# Patient Record
Sex: Female | Born: 1975 | Race: White | Hispanic: No | Marital: Married | Smoking: Never smoker
Health system: Southern US, Community
[De-identification: ages and names within clinical notes are randomized; demographics above are authoritative.]

## PROBLEM LIST (undated history)

## (undated) DIAGNOSIS — F32A Depression, unspecified: Secondary | ICD-10-CM

## (undated) DIAGNOSIS — E7800 Pure hypercholesterolemia, unspecified: Secondary | ICD-10-CM

## (undated) DIAGNOSIS — E282 Polycystic ovarian syndrome: Secondary | ICD-10-CM

## (undated) DIAGNOSIS — R7303 Prediabetes: Secondary | ICD-10-CM

## (undated) DIAGNOSIS — E559 Vitamin D deficiency, unspecified: Secondary | ICD-10-CM

## (undated) DIAGNOSIS — I10 Essential (primary) hypertension: Secondary | ICD-10-CM

## (undated) DIAGNOSIS — N979 Female infertility, unspecified: Secondary | ICD-10-CM

## (undated) DIAGNOSIS — K760 Fatty (change of) liver, not elsewhere classified: Secondary | ICD-10-CM

## (undated) DIAGNOSIS — E538 Deficiency of other specified B group vitamins: Secondary | ICD-10-CM

## (undated) HISTORY — DX: Vitamin D deficiency, unspecified: E55.9

## (undated) HISTORY — DX: Female infertility, unspecified: N97.9

## (undated) HISTORY — DX: Fatty (change of) liver, not elsewhere classified: K76.0

## (undated) HISTORY — PX: TONSILLECTOMY: SUR1361

## (undated) HISTORY — DX: Deficiency of other specified B group vitamins: E53.8

## (undated) HISTORY — DX: Prediabetes: R73.03

## (undated) HISTORY — DX: Depression, unspecified: F32.A

## (undated) HISTORY — DX: Essential (primary) hypertension: I10

## (undated) HISTORY — DX: Pure hypercholesterolemia, unspecified: E78.00

## (undated) HISTORY — DX: Polycystic ovarian syndrome: E28.2

---
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
## 2000-08-04 ENCOUNTER — Encounter: Admission: RE | Admit: 2000-08-04 | Discharge: 2000-08-04 | Payer: Self-pay | Admitting: Internal Medicine

## 2000-08-04 ENCOUNTER — Encounter: Payer: Self-pay | Admitting: Internal Medicine

## 2001-02-09 ENCOUNTER — Other Ambulatory Visit: Admission: RE | Admit: 2001-02-09 | Discharge: 2001-02-09 | Payer: Self-pay | Admitting: Gynecology

## 2002-02-13 ENCOUNTER — Other Ambulatory Visit: Admission: RE | Admit: 2002-02-13 | Discharge: 2002-02-13 | Payer: Self-pay | Admitting: Gynecology

## 2002-05-24 ENCOUNTER — Emergency Department (HOSPITAL_COMMUNITY): Admission: EM | Admit: 2002-05-24 | Discharge: 2002-05-25 | Payer: Self-pay | Admitting: Emergency Medicine

## 2002-05-25 ENCOUNTER — Ambulatory Visit (HOSPITAL_COMMUNITY): Admission: RE | Admit: 2002-05-25 | Discharge: 2002-05-25 | Payer: Self-pay | Admitting: Surgery

## 2002-05-25 ENCOUNTER — Encounter: Payer: Self-pay | Admitting: Surgery

## 2002-07-27 ENCOUNTER — Encounter: Payer: Self-pay | Admitting: Urology

## 2002-07-27 ENCOUNTER — Encounter: Admission: RE | Admit: 2002-07-27 | Discharge: 2002-07-27 | Payer: Self-pay | Admitting: Urology

## 2003-02-28 ENCOUNTER — Other Ambulatory Visit: Admission: RE | Admit: 2003-02-28 | Discharge: 2003-02-28 | Payer: Self-pay | Admitting: Gynecology

## 2003-03-02 ENCOUNTER — Emergency Department (HOSPITAL_COMMUNITY): Admission: EM | Admit: 2003-03-02 | Discharge: 2003-03-02 | Payer: Self-pay | Admitting: Emergency Medicine

## 2003-04-27 ENCOUNTER — Ambulatory Visit (HOSPITAL_COMMUNITY): Admission: RE | Admit: 2003-04-27 | Discharge: 2003-04-27 | Payer: Self-pay | Admitting: Gastroenterology

## 2003-08-18 HISTORY — PX: GALLBLADDER SURGERY: SHX652

## 2004-06-25 ENCOUNTER — Encounter: Admission: RE | Admit: 2004-06-25 | Discharge: 2004-06-25 | Payer: Self-pay | Admitting: Internal Medicine

## 2004-06-26 ENCOUNTER — Ambulatory Visit (HOSPITAL_COMMUNITY): Admission: RE | Admit: 2004-06-26 | Discharge: 2004-06-26 | Payer: Self-pay | Admitting: Internal Medicine

## 2004-07-03 ENCOUNTER — Encounter: Admission: RE | Admit: 2004-07-03 | Discharge: 2004-07-03 | Payer: Self-pay | Admitting: Internal Medicine

## 2004-07-15 ENCOUNTER — Encounter: Admission: RE | Admit: 2004-07-15 | Discharge: 2004-07-15 | Payer: Self-pay | Admitting: Gastroenterology

## 2004-07-22 ENCOUNTER — Encounter: Admission: RE | Admit: 2004-07-22 | Discharge: 2004-07-22 | Payer: Self-pay | Admitting: Internal Medicine

## 2004-08-06 ENCOUNTER — Ambulatory Visit (HOSPITAL_COMMUNITY): Admission: RE | Admit: 2004-08-06 | Discharge: 2004-08-06 | Payer: Self-pay | Admitting: Gastroenterology

## 2004-08-22 ENCOUNTER — Ambulatory Visit (HOSPITAL_COMMUNITY): Admission: RE | Admit: 2004-08-22 | Discharge: 2004-08-22 | Payer: Self-pay | Admitting: Gastroenterology

## 2004-12-11 ENCOUNTER — Encounter: Admission: RE | Admit: 2004-12-11 | Discharge: 2005-03-11 | Payer: Self-pay | Admitting: Obstetrics and Gynecology

## 2005-03-24 ENCOUNTER — Ambulatory Visit: Payer: Self-pay | Admitting: Internal Medicine

## 2005-04-01 ENCOUNTER — Ambulatory Visit (HOSPITAL_COMMUNITY): Admission: RE | Admit: 2005-04-01 | Discharge: 2005-04-01 | Payer: Self-pay | Admitting: Internal Medicine

## 2005-04-16 ENCOUNTER — Ambulatory Visit: Payer: Self-pay | Admitting: Internal Medicine

## 2005-06-29 ENCOUNTER — Ambulatory Visit (HOSPITAL_COMMUNITY): Admission: RE | Admit: 2005-06-29 | Discharge: 2005-06-29 | Payer: Self-pay | Admitting: Surgery

## 2006-06-17 ENCOUNTER — Encounter: Admission: RE | Admit: 2006-06-17 | Discharge: 2006-06-17 | Payer: Self-pay | Admitting: Internal Medicine

## 2006-06-24 ENCOUNTER — Encounter: Admission: RE | Admit: 2006-06-24 | Discharge: 2006-06-24 | Payer: Self-pay | Admitting: Internal Medicine

## 2006-07-05 ENCOUNTER — Encounter: Admission: RE | Admit: 2006-07-05 | Discharge: 2006-08-02 | Payer: Self-pay | Admitting: Internal Medicine

## 2007-11-11 IMAGING — CR DG LUMBAR SPINE COMPLETE 4+V
5 series · 5 of 5 positions shown · non-contrast
Comparison: none

CLINICAL DATA: Bilateral leg numbness. 
LUMBAR SPINE ? 5 VIEW:

[view not recorded (1 of 5)]
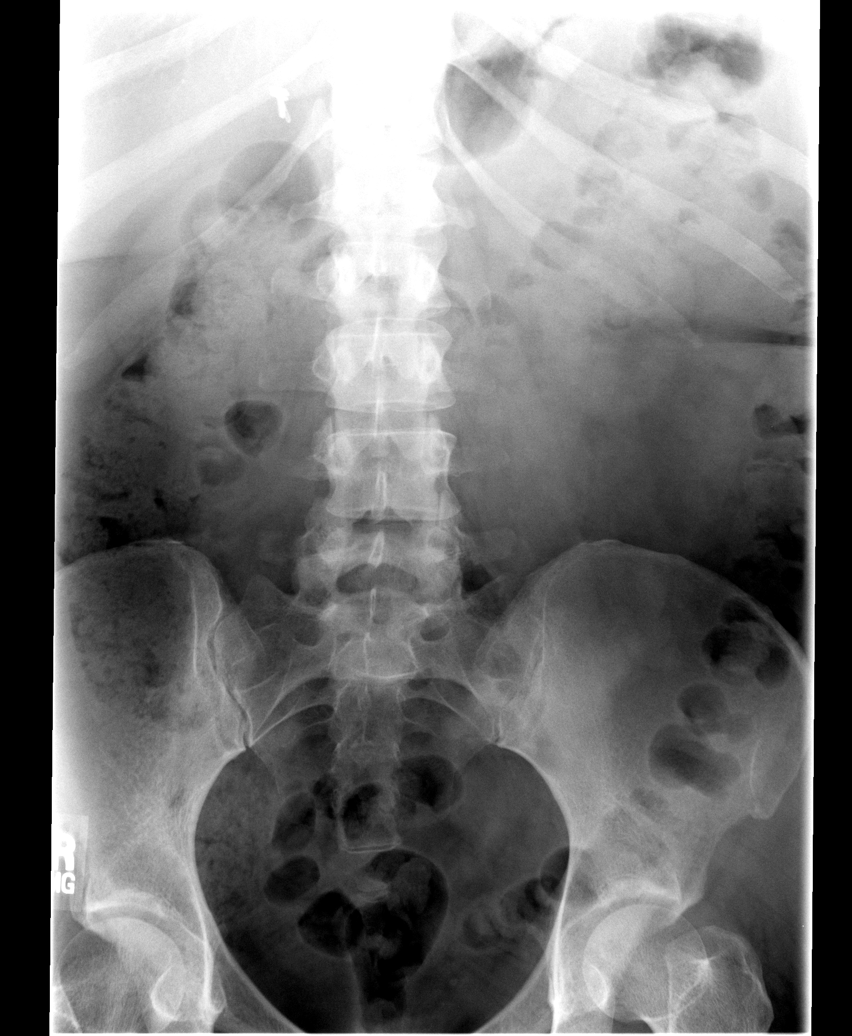

[view not recorded (2 of 5)]
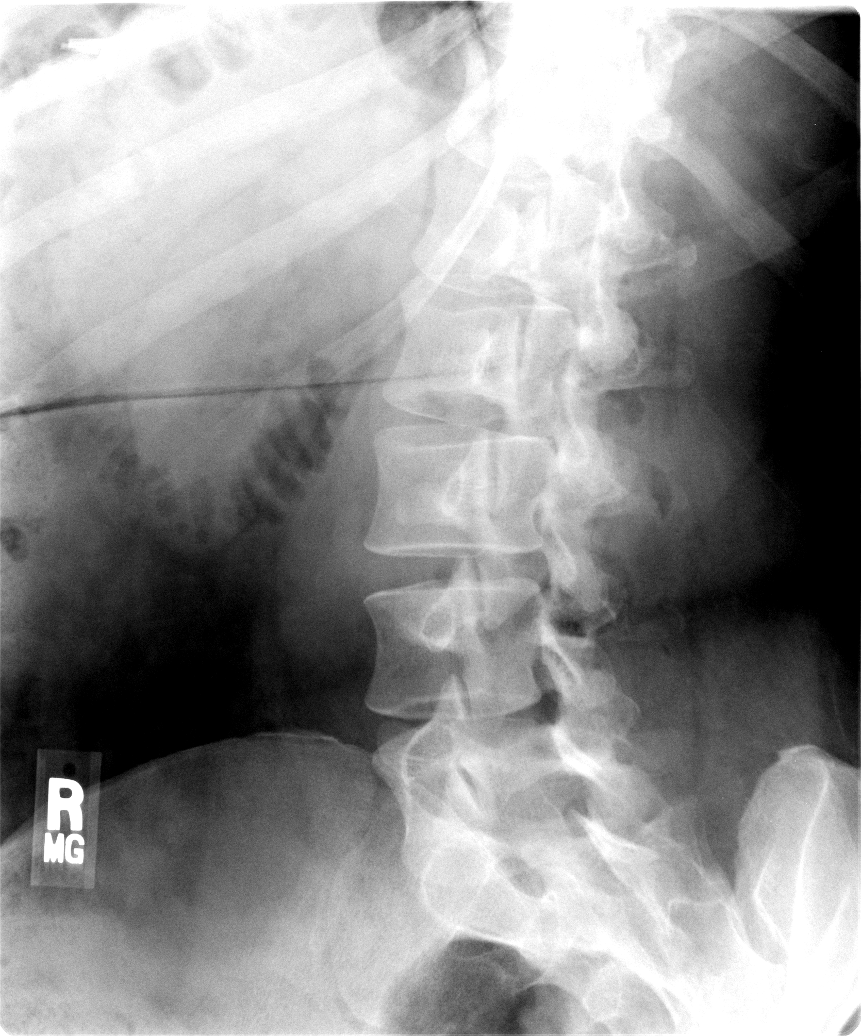

[view not recorded (3 of 5)]
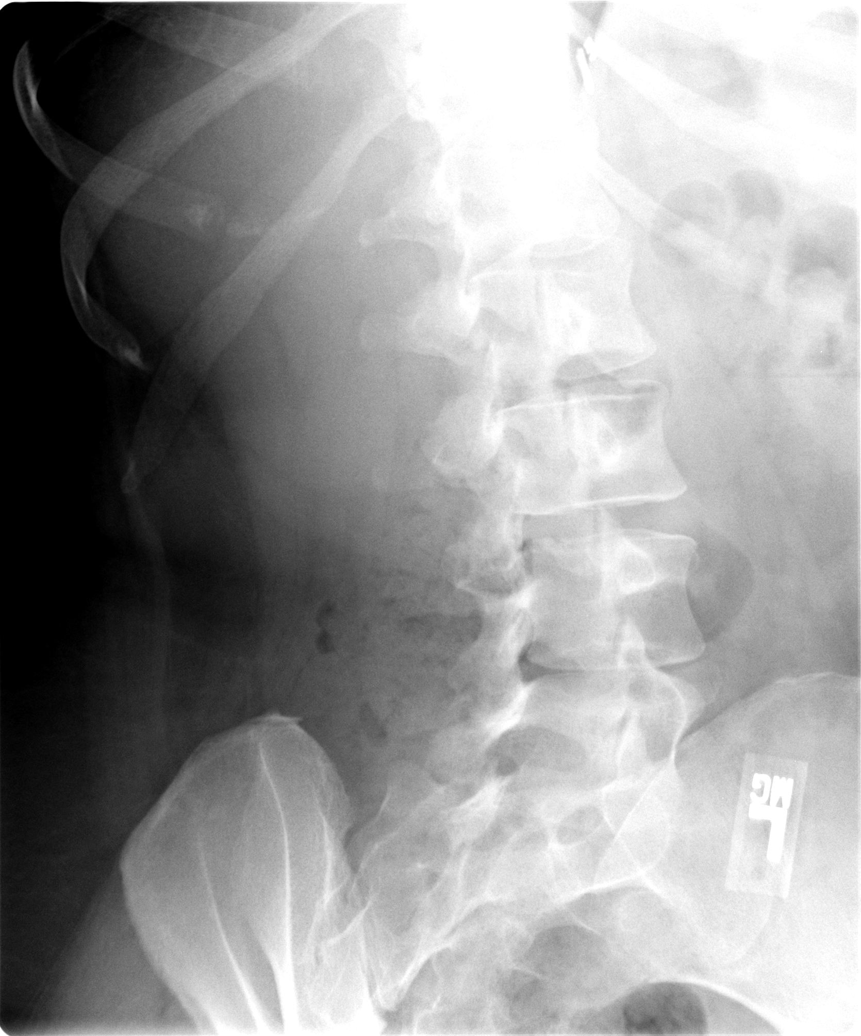

[view not recorded (4 of 5)]
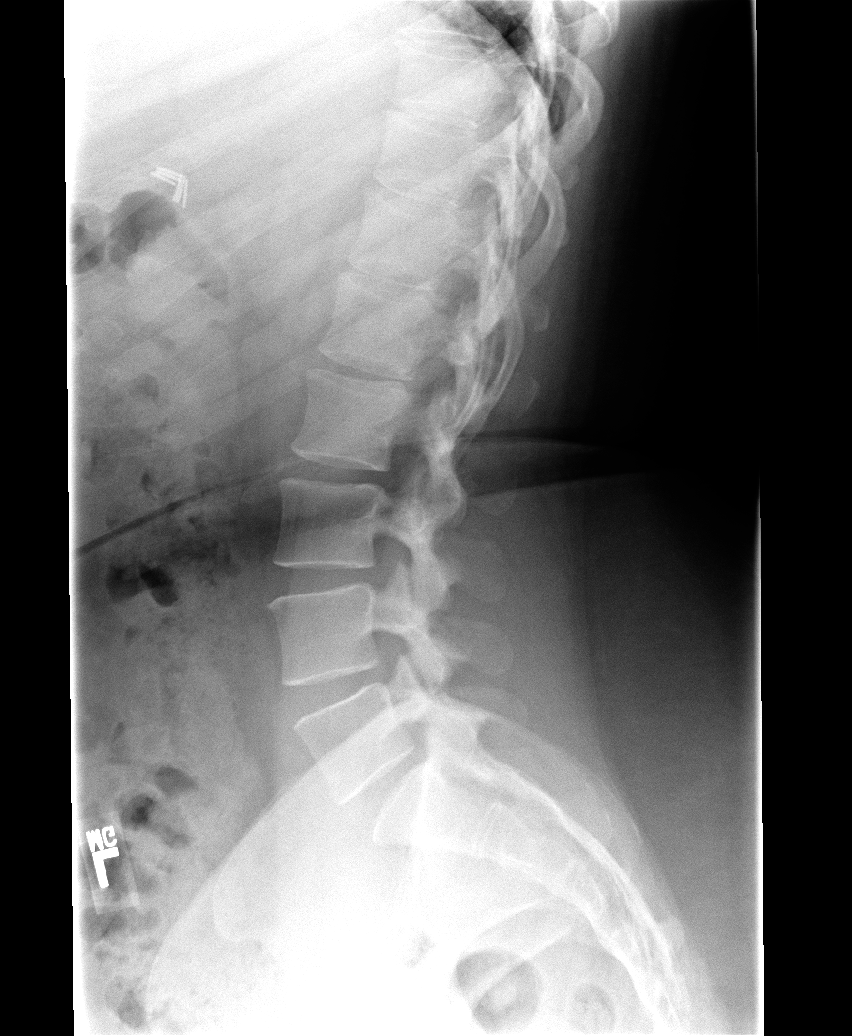

[view not recorded (5 of 5)]
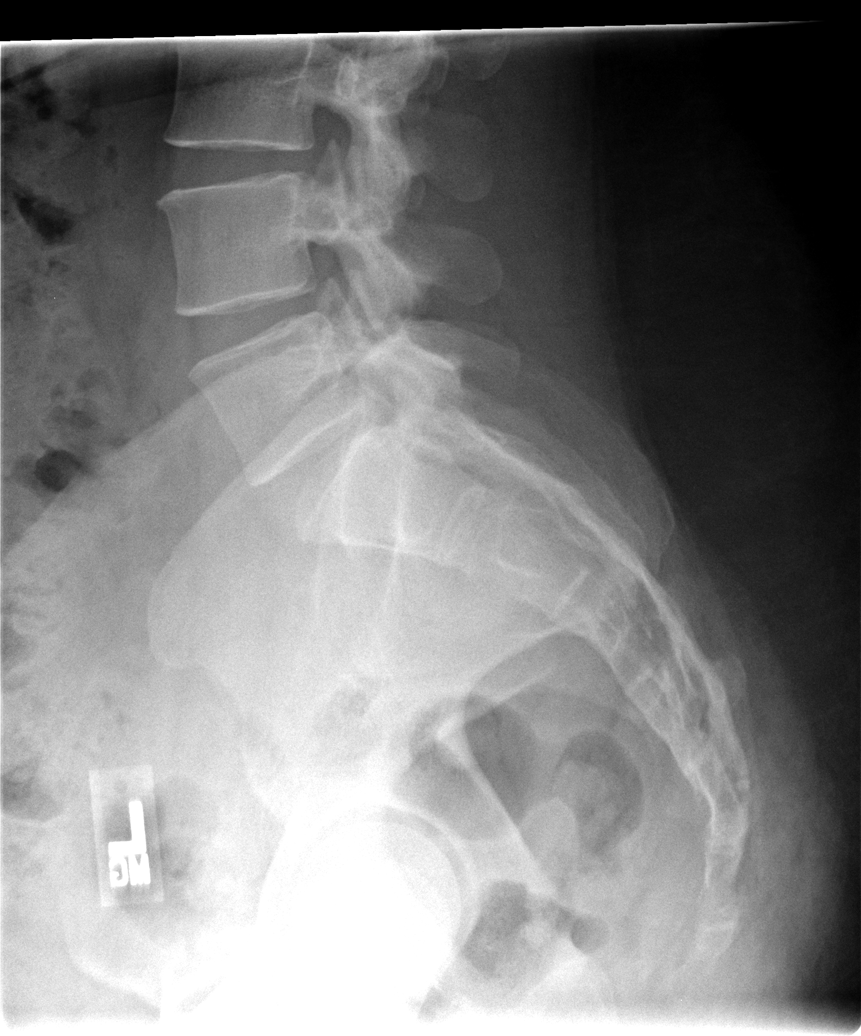

[5 of 5 positions shown; findings below may reference images not displayed]

FINDINGS: There is no evidence of lumbar spine fracture.  Alignment is normal.  Intervertebral disc spaces are maintained, and no other significant bone abnormalities are identified.
IMPRESSION: Negative lumbar spine radiographs.

## 2010-09-07 ENCOUNTER — Encounter: Payer: Self-pay | Admitting: Unknown Physician Specialty

## 2011-06-18 HISTORY — PX: DILATION AND EVACUATION: SHX1459

## 2021-10-02 LAB — BASIC METABOLIC PANEL
BUN: 13 (ref 4–21)
Chloride: 100 (ref 99–108)
Creatinine: 0.7 (ref 0.5–1.1)
Sodium: 135 — AB (ref 137–147)

## 2021-10-02 LAB — LIPID PANEL
Cholesterol: 162 (ref 0–200)
LDL Cholesterol: 99
Triglycerides: 109 (ref 40–160)

## 2021-10-02 LAB — HEPATIC FUNCTION PANEL
ALT: 19 U/L (ref 7–35)
AST: 21 (ref 13–35)
Alkaline Phosphatase: 134 — AB (ref 25–125)

## 2021-10-02 LAB — CBC: RBC: 4.6 (ref 3.87–5.11)

## 2021-10-02 LAB — HEMOGLOBIN A1C: Hemoglobin A1C: 5.5

## 2021-10-02 LAB — CBC AND DIFFERENTIAL: WBC: 9.8

## 2021-10-02 LAB — VITAMIN D 25 HYDROXY (VIT D DEFICIENCY, FRACTURES): Vit D, 25-Hydroxy: 25.1

## 2021-10-31 DIAGNOSIS — Z0289 Encounter for other administrative examinations: Secondary | ICD-10-CM

## 2021-11-11 ENCOUNTER — Encounter (INDEPENDENT_AMBULATORY_CARE_PROVIDER_SITE_OTHER): Payer: Self-pay | Admitting: Family Medicine

## 2021-11-11 ENCOUNTER — Ambulatory Visit (INDEPENDENT_AMBULATORY_CARE_PROVIDER_SITE_OTHER): Payer: 59 | Admitting: Family Medicine

## 2021-11-11 ENCOUNTER — Encounter (INDEPENDENT_AMBULATORY_CARE_PROVIDER_SITE_OTHER): Payer: Self-pay

## 2021-11-11 ENCOUNTER — Other Ambulatory Visit (INDEPENDENT_AMBULATORY_CARE_PROVIDER_SITE_OTHER): Payer: Self-pay

## 2021-11-11 ENCOUNTER — Other Ambulatory Visit: Payer: Self-pay

## 2021-11-11 VITALS — BP 124/84 | HR 72 | Temp 97.9°F | Ht 64.0 in | Wt 273.0 lb

## 2021-11-11 DIAGNOSIS — R0602 Shortness of breath: Secondary | ICD-10-CM

## 2021-11-11 DIAGNOSIS — E66813 Obesity, class 3: Secondary | ICD-10-CM

## 2021-11-11 DIAGNOSIS — R5383 Other fatigue: Secondary | ICD-10-CM

## 2021-11-11 DIAGNOSIS — Z6841 Body Mass Index (BMI) 40.0 and over, adult: Secondary | ICD-10-CM

## 2021-11-11 DIAGNOSIS — I10 Essential (primary) hypertension: Secondary | ICD-10-CM

## 2021-11-11 DIAGNOSIS — E669 Obesity, unspecified: Secondary | ICD-10-CM

## 2021-11-11 DIAGNOSIS — Z1331 Encounter for screening for depression: Secondary | ICD-10-CM | POA: Diagnosis not present

## 2021-11-11 DIAGNOSIS — K760 Fatty (change of) liver, not elsewhere classified: Secondary | ICD-10-CM | POA: Diagnosis not present

## 2021-11-11 DIAGNOSIS — E8881 Metabolic syndrome: Secondary | ICD-10-CM

## 2021-11-11 DIAGNOSIS — E88819 Insulin resistance, unspecified: Secondary | ICD-10-CM

## 2021-11-11 DIAGNOSIS — E042 Nontoxic multinodular goiter: Secondary | ICD-10-CM

## 2021-11-11 DIAGNOSIS — E559 Vitamin D deficiency, unspecified: Secondary | ICD-10-CM

## 2021-11-11 DIAGNOSIS — E7849 Other hyperlipidemia: Secondary | ICD-10-CM

## 2021-11-12 LAB — VITAMIN B12: Vitamin B-12: 710 pg/mL (ref 232–1245)

## 2021-11-12 LAB — COMPREHENSIVE METABOLIC PANEL
ALT: 19 IU/L (ref 0–32)
AST: 16 IU/L (ref 0–40)
Albumin/Globulin Ratio: 1.7 (ref 1.2–2.2)
Albumin: 4.5 g/dL (ref 3.8–4.8)
Alkaline Phosphatase: 157 IU/L — ABNORMAL HIGH (ref 44–121)
BUN/Creatinine Ratio: 19 (ref 9–23)
BUN: 13 mg/dL (ref 6–24)
Bilirubin Total: 0.3 mg/dL (ref 0.0–1.2)
CO2: 20 mmol/L (ref 20–29)
Calcium: 9.8 mg/dL (ref 8.7–10.2)
Chloride: 102 mmol/L (ref 96–106)
Creatinine, Ser: 0.68 mg/dL (ref 0.57–1.00)
Globulin, Total: 2.6 g/dL (ref 1.5–4.5)
Glucose: 87 mg/dL (ref 70–99)
Potassium: 4.4 mmol/L (ref 3.5–5.2)
Sodium: 135 mmol/L (ref 134–144)
Total Protein: 7.1 g/dL (ref 6.0–8.5)
eGFR: 111 mL/min/{1.73_m2} (ref >59)

## 2021-11-12 LAB — FOLATE: Folate: >20 ng/mL (ref >3.0)

## 2021-11-17 NOTE — Progress Notes (Signed)
? ? ? ? ?Chief Complaint:  ? ?OBESITY ?Sabrina Jones (MR# [id number]) is a 46 y.o. female who presents for evaluation and treatment of obesity and related comorbidities. Current BMI is Body mass index is 46.86 kg/m?Marland Kitchen Sabrina Jones has been struggling with her weight for many years and has been unsuccessful in either losing weight, maintaining weight loss, or reaching her healthy weight goal. ? 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 She has previously lived in [place] and just moved back to [place]. Skips breakfast in the past. Breakfast-2 eggs with spray oil, maybe decaf coffee (satisfied). Lunch-Mexican restaurant-chips, salsa, grilled chicken salad, and guacamole (doesntt eat whole portion) (full) Dinner-meat, Maybe 6 oz, 2 cups of vegetables (feels full). ? ?Sabrina Jones is currently in the action stage of change and ready to dedicate time achieving and maintaining a healthier weight. Sabrina Jones is interested in becoming our patient and working on intensive lifestyle modifications including (but not limited to) diet and exercise for weight loss. ? ?Sabrina Jones's habits were reviewed today and are as follows: Her family eats meals together, she thinks her family will eat healthier with her, her desired weight loss is 123-133 lbs, she has been heavy most of her life, she started gaining weight during teen years, her heaviest weight ever was 273 pounds, she has significant food cravings issues, she skips meals frequently, she is frequently drinking liquids with calories, she frequently makes poor food choices, and she struggles with emotional eating. ? ?Depression Screen ?Sabrina Jones's Food and Mood (modified PHQ-9) score was 7. ? ? ?  11/11/2021  ?  8:40 AM  ?Depression screen PHQ 2/9  ?Decreased Interest 2  ?Down, Depressed, Hopeless 1  ?PHQ - 2 Score 3  ?Altered sleeping 0  ?Tired, decreased energy 2  ?Change in appetite 1  ?Feeling bad or failure about yourself  1  ?Trouble concentrating 0  ?Moving slowly or  fidgety/restless 0  ?Suicidal thoughts 0  ?PHQ-9 Score 7  ?Difficult doing work/chores Somewhat difficult  ? ?Subjective:  ? ?1. Other fatigue ?Sabrina Jones admits to daytime somnolence and denies waking up still tired. Patient has a history of symptoms of daytime fatigue. Sabrina Jones generally gets 8 or 10 hours of sleep per night, and states that she has generally restful sleep. Snoring is present. Apneic episodes are not present. Epworth Sleepiness Score is 1. EKG-NSR at  65 BPM. ? ?2. SOBOE (shortness of breath on exertion) ?Sabrina Jones notes increasing shortness of breath with exercising and seems to be worsening over time with weight gain. She notes getting out of breath sooner with activity than she used to. This has not gotten worse recently. Sabrina Jones denies shortness of breath at rest or orthopnea. ? ?3. Multiple thyroid nodules ?Sabrina Jones had blood work in [place]. TSH 0.83, T3 3.45, and T4 1.28. She brought her labs in. Ultrasound on 10/02/2021-new nodules in both lobes of thyroid. Possibility of adenomatous nodules.  ? ?4. NAFLD (nonalcoholic fatty liver disease) ?Sabrina Jones was diagnosed years ago. Brought her labs with her. Ultrasound 10/02/2021-grade 3 fatty infiltration of the liver. ALT was 19, AST 21, and Alk Phos 134. ? ?5. Essential hypertension ?Sabrina Jones's blood pressure is controlled today. She was diagnosed in early college years. She is taking Diovan and nebivolol. She reports labile blood pressure in the past.  ? ?6. Other hyperlipidemia ?Sabrina Jones is on Crestor 5 mg. She was diagnosed a few years ago. Last LDL was 99, HDL 41.6, and triglycerides 109-labs done 10/02/2021. LFTs within normal limits. ? ?7.  Insulin resistance ?Sabrina Jones's last A1c was 5.5 and insulin 24.9 on 10/02/2021. She is not on medications.  ? ?8. Vitamin D deficiency ?Sabrina Jones's last Vitamin D level was of 25.1. She is on 50,000 IU weekly.  ? ?Assessment/Plan:  ? ?1. Other fatigue ?Sabrina Jones does feel that her weight is causing her energy to be lower than it  should be. Fatigue may be related to obesity, depression or many other causes. Labs will be ordered, and in the meanwhile, Sabrina Jones will focus on self care including making healthy food choices, increasing physical activity and focusing on stress reduction. ? ?- EKG 12-Lead ?- Vitamin B12 ?- Folate ? ?2. SOBOE (shortness of breath on exertion) ?Sabrina Jones does feel that she gets out of breath more easily that she used to when she exercises. Sabrina Jones's shortness of breath appears to be obesity related and exercise induced. She has agreed to work on weight loss and gradually increase exercise to treat her exercise induced shortness of breath. Will continue to monitor closely. ? ?3. Multiple thyroid nodules ?We will refer to Dr. Cruzita Lederer at [facility]. ? ?- Ambulatory referral to Endocrinology ? ?4. NAFLD (nonalcoholic fatty liver disease) ?We will check labs today, and we will follow up at Sabrina Jones next appointment. ? ?5. Essential hypertension ?We will check labs today, and we will follow up at Sabrina Jones next appointment. ? ?- Comprehensive metabolic panel ? ?6. Other hyperlipidemia ?We will repeat labs in 3 months.  ? ?7. Insulin resistance ?We will repeat labs in 3 months.  ? ?8. Vitamin D deficiency ?Sabrina Jones will continue her Vitamin D. We will repeat labs in 3 months.  ? ?9. Depression screening ?Sabrina Jones had a positive depression screening. Depression is commonly associated with obesity and often results in emotional eating behaviors. We will monitor this closely and work on CBT to help improve the non-hunger eating patterns. Referral to Psychology may be required if no improvement is seen as she continues in our clinic. ? ?10. Obesity with current BMI of 46.9 ?Sabrina Jones is currently in the action stage of change and her goal is to continue with weight loss efforts. I recommend Sabrina Jones begin the structured treatment plan as follows: ? ?She has agreed to the Category 4 Plan with 8 oz at night. ? ?Exercise goals: No  exercise has been prescribed at this time.  ? ?Behavioral modification strategies: increasing lean protein intake, no skipping meals, meal planning and cooking strategies, keeping healthy foods in the home, and planning for success. ? ?She was informed of the importance of frequent follow-up visits to maximize her success with intensive lifestyle modifications for her multiple health conditions. She was informed we would discuss her lab results at her next visit unless there is a critical issue that needs to be addressed sooner. Sabrina Jones agreed to keep her next visit at the agreed upon time to discuss these results. ? ?Objective:  ? ?Blood pressure 124/84, pulse 72, temperature 97.9 ?F (36.6 ?C), height 5' 4"  (1.626 m), weight 273 lb (123.8 kg), SpO2 98 %. Body mass index is 46.86 kg/m?. ? ?EKG: Normal sinus rhythm, rate 65 BPM. ? ?Indirect Calorimeter completed today shows a VO2 of 291 and a REE of 2002.  Her calculated basal metabolic rate is 5701 thus her basal metabolic rate is better than expected. ? ?General: Cooperative, alert, well developed, in no acute distress. ?HEENT: Conjunctivae and lids unremarkable. ?Cardiovascular: Regular rhythm.  ?Lungs: Normal work of breathing. ?Neurologic: No focal deficits.  ? ?Lab Results  ?Component Value Date  ?  CREATININE 0.68 11/11/2021  ? BUN 13 11/11/2021  ? NA 135 11/11/2021  ? K 4.4 11/11/2021  ? CL 102 11/11/2021  ? CO2 20 11/11/2021  ? ?Lab Results  ?Component Value Date  ? ALT 19 11/11/2021  ? AST 16 11/11/2021  ? ALKPHOS 157 (H) 11/11/2021  ? BILITOT 0.3 11/11/2021  ? ?Lab Results  ?Component Value Date  ? HGBA1C 5.5 10/02/2021  ? ?No results found for: INSULIN ?Lab Results  ?Component Value Date  ? TSH 0.83 10/02/2021  ? ?Lab Results  ?Component Value Date  ? CHOL 162 10/02/2021  ? HDL 42 10/02/2021  ? [place] 99 10/02/2021  ? TRIG 109 10/02/2021  ? ?Lab Results  ?Component Value Date  ? WBC 9.8 10/02/2021  ? HGB 13.6 10/02/2021  ? HCT 42 10/02/2021  ? PLT 319  10/02/2021  ? ?Lab Results  ?Component Value Date  ? TIBC 255 10/02/2021  ? FERRITIN 235.0 10/02/2021  ? ?Attestation Statements:  ? ?Reviewed by clinician on day of visit: allergies, medications, problem list, medical history

## 2021-11-25 ENCOUNTER — Ambulatory Visit (INDEPENDENT_AMBULATORY_CARE_PROVIDER_SITE_OTHER): Payer: 59 | Admitting: Family Medicine

## 2021-11-25 ENCOUNTER — Encounter (INDEPENDENT_AMBULATORY_CARE_PROVIDER_SITE_OTHER): Payer: Self-pay | Admitting: Family Medicine

## 2021-11-25 VITALS — BP 136/85 | HR 79 | Temp 98.6°F | Ht 64.0 in | Wt 272.0 lb

## 2021-11-25 DIAGNOSIS — E559 Vitamin D deficiency, unspecified: Secondary | ICD-10-CM

## 2021-11-25 DIAGNOSIS — E7849 Other hyperlipidemia: Secondary | ICD-10-CM | POA: Diagnosis not present

## 2021-11-25 DIAGNOSIS — I10 Essential (primary) hypertension: Secondary | ICD-10-CM

## 2021-11-25 DIAGNOSIS — E8881 Metabolic syndrome: Secondary | ICD-10-CM | POA: Diagnosis not present

## 2021-11-25 DIAGNOSIS — E042 Nontoxic multinodular goiter: Secondary | ICD-10-CM

## 2021-11-25 DIAGNOSIS — E669 Obesity, unspecified: Secondary | ICD-10-CM

## 2021-11-25 DIAGNOSIS — Z6841 Body Mass Index (BMI) 40.0 and over, adult: Secondary | ICD-10-CM

## 2021-11-27 NOTE — Progress Notes (Signed)
? ? ? ?Chief Complaint:  ? ?OBESITY ?Sabrina Jones is here to discuss her progress with her obesity treatment plan along with follow-up of her obesity related diagnoses. Sabrina Jones is on the Category 4 Plan + 8 oz at dinner and states she is following her eating plan approximately 95% of the time. Sabrina Jones states she is walking 10,000 steps 5 times per week.   ? ?Today's visit was #: 2 ?Starting weight: 273 lbs ?Starting date: 11/11/2021 ?Today's weight: 272 lbs ?Today's date: 11/25/2021 ?Total lbs lost to date: 1 ?Total lbs lost since last in-office visit: 1 ? ?Interim History: Sabrina Jones's first few weeks weren't bad. She is able to get all in the food in. Going away for a few days for uncle Cloretta Jones, but then will be home. Staying with her mom until she finds a place to live in [place]. Choosing snacks off of snack sheet. Trying to eat more nutritious options while eating out.  ? ?Subjective:  ? ?1. Essential hypertension ?Kenlei's blood pressure is well controlled today. She is on Diovan and Bystolic. She denies chest pain, chest pressure, or headache.  ? ?2. Other hyperlipidemia ?Jatziri is on Crestor 5 mg daily. Last LDL was 99, HDL 42, and triglycerides 630. ? ?3. Insulin resistance ?Jowanda's last A1c was 5.5 and insulin 24.9. She is not on medications.  ? ?4. Vitamin D deficiency ?Lakedra is on prescription Vitamin D 50,000 IU  weekly, and she notes fatigue.  ? ?5. Multiple thyroid nodules ?Sabrina Jones's recent ultrasound finding increased slow increase in nodules. Referral was [facility] to [facility].  ? ?Assessment/Plan:  ? ?1. Essential hypertension ?Sabrina Jones will continue her current medications, with no change in medications and doses.  ? ?2. Other hyperlipidemia ?Sabrina Jones will continue Crestor , with no change in medications or dose. We will repeat labs in 3 months.  ? ?3. Insulin resistance ?Sabrina Jones will continue Category 3 or Category 4, with no change in treatment.  ? ?4. Vitamin D deficiency ?Sabrina Jones will continue  prescription Vitamin D 50,000 IU every week and will follow-up for routine testing of Vitamin D, at least 2-3 times per year to avoid over-replacement. ? ?5. Multiple thyroid nodules ?We will follow up at her next appointment on if Sabrina Jones has gotten scheduled for Endocrinology.  ? ?6. Obesity with current BMI of 46.8 ?Aleksia is currently in the action stage of change. As such, her goal is to continue with weight loss efforts. She has agreed to the Category 3 Plan + 100 calories.  ? ?Exercise goals: All adults should avoid inactivity. Some physical activity is better than none, and adults who participate in any amount of physical activity gain some health benefits. ? ?Behavioral modification strategies: increasing lean protein intake, meal planning and cooking strategies, and keeping healthy foods in the home. ? ?Sabrina Jones has agreed to follow-up with our clinic in 2 to 3 weeks. She was informed of the importance of frequent follow-up visits to maximize her success with intensive lifestyle modifications for her multiple health conditions.  ? ?Objective:  ? ?Blood pressure 136/85, pulse 79, temperature 98.6 ?F (37 ?Sabrina), height 5\' 4"  (1.626 m), weight 272 lb (123.4 kg), last menstrual period 11/01/2021, SpO2 98 %. ?Body mass index is 46.69 kg/m?. ? ?General: Cooperative, alert, well developed, in no acute distress. ?HEENT: Conjunctivae and lids unremarkable. ?Cardiovascular: Regular rhythm.  ?Lungs: Normal work of breathing. ?Neurologic: No focal deficits.  ? ?Lab Results  ?Component Value Date  ? CREATININE 0.68 11/11/2021  ? BUN 13 11/11/2021  ?  NA 135 11/11/2021  ? K 4.4 11/11/2021  ? CL 102 11/11/2021  ? CO2 20 11/11/2021  ? ?Lab Results  ?Component Value Date  ? ALT 19 11/11/2021  ? AST 16 11/11/2021  ? ALKPHOS 157 (H) 11/11/2021  ? BILITOT 0.3 11/11/2021  ? ?Lab Results  ?Component Value Date  ? HGBA1C 5.5 10/02/2021  ? ?No results found for: INSULIN ?Lab Results  ?Component Value Date  ? TSH 0.83 10/02/2021  ? ?Lab  Results  ?Component Value Date  ? CHOL 162 10/02/2021  ? HDL 42 10/02/2021  ? LDLCALC 99 10/02/2021  ? TRIG 109 10/02/2021  ? ?Lab Results  ?Component Value Date  ? VD25OH 25.1 10/02/2021  ? ?Lab Results  ?Component Value Date  ? WBC 9.8 10/02/2021  ? HGB 13.6 10/02/2021  ? HCT 42 10/02/2021  ? PLT 319 10/02/2021  ? ?Lab Results  ?Component Value Date  ? TIBC 255 10/02/2021  ? FERRITIN 235.0 10/02/2021  ? ?Attestation Statements:  ? ?Reviewed by clinician on day of visit: allergies, medications, problem list, medical history, surgical history, family history, social history, and previous encounter notes. ? ? ?I, Burt Knack, am acting as transcriptionist for Sabrina Likes, MD. ?I have reviewed the above documentation for accuracy and completeness, and I agree with the above. Sabrina Likes, MD ? ? ?

## 2021-12-10 ENCOUNTER — Ambulatory Visit (INDEPENDENT_AMBULATORY_CARE_PROVIDER_SITE_OTHER): Payer: 59 | Admitting: Family Medicine

## 2021-12-10 ENCOUNTER — Encounter (INDEPENDENT_AMBULATORY_CARE_PROVIDER_SITE_OTHER): Payer: Self-pay | Admitting: Family Medicine

## 2021-12-10 VITALS — BP 120/80 | HR 84 | Temp 98.0°F | Ht 64.0 in | Wt 272.0 lb

## 2021-12-10 DIAGNOSIS — E669 Obesity, unspecified: Secondary | ICD-10-CM | POA: Diagnosis not present

## 2021-12-10 DIAGNOSIS — Z6841 Body Mass Index (BMI) 40.0 and over, adult: Secondary | ICD-10-CM

## 2021-12-10 DIAGNOSIS — I10 Essential (primary) hypertension: Secondary | ICD-10-CM | POA: Diagnosis not present

## 2021-12-10 DIAGNOSIS — E66813 Obesity, class 3: Secondary | ICD-10-CM

## 2021-12-10 DIAGNOSIS — Z9189 Other specified personal risk factors, not elsewhere classified: Secondary | ICD-10-CM

## 2021-12-10 DIAGNOSIS — E559 Vitamin D deficiency, unspecified: Secondary | ICD-10-CM | POA: Diagnosis not present

## 2021-12-11 MED ORDER — NEBIVOLOL HCL 5 MG PO TABS: 5.0000 mg | ORAL_TABLET | Freq: Every day | ORAL | 0 refills | Status: DC

## 2021-12-11 MED ORDER — ERGOCALCIFEROL 1.25 MG (50000 UT) PO CAPS: 50000.0000 [IU] | ORAL_CAPSULE | ORAL | 0 refills | Status: AC

## 2021-12-16 ENCOUNTER — Encounter (HOSPITAL_COMMUNITY): Payer: Self-pay | Admitting: Emergency Medicine

## 2021-12-16 ENCOUNTER — Ambulatory Visit
Admission: EM | Admit: 2021-12-16 | Discharge: 2021-12-16 | Disposition: A | Discharge status: Home / Self Care | Payer: 59 | Attending: Internal Medicine | Admitting: Internal Medicine

## 2021-12-16 ENCOUNTER — Emergency Department (HOSPITAL_COMMUNITY): Payer: 59

## 2021-12-16 ENCOUNTER — Emergency Department (HOSPITAL_COMMUNITY)
Admission: EM | Admit: 2021-12-16 | Discharge: 2021-12-17 | Disposition: A | Discharge status: Home / Self Care | Payer: 59 | Attending: Emergency Medicine | Admitting: Emergency Medicine

## 2021-12-16 ENCOUNTER — Other Ambulatory Visit: Payer: Self-pay

## 2021-12-16 DIAGNOSIS — R0789 Other chest pain: Secondary | ICD-10-CM | POA: Diagnosis not present

## 2021-12-16 DIAGNOSIS — K219 Gastro-esophageal reflux disease without esophagitis: Secondary | ICD-10-CM | POA: Diagnosis not present

## 2021-12-16 DIAGNOSIS — I10 Essential (primary) hypertension: Secondary | ICD-10-CM | POA: Insufficient documentation

## 2021-12-16 DIAGNOSIS — D72829 Elevated white blood cell count, unspecified: Secondary | ICD-10-CM | POA: Diagnosis not present

## 2021-12-16 LAB — CBC
HCT: 42.3 % (ref 36.0–46.0)
Hemoglobin: 13.9 g/dL (ref 12.0–15.0)
MCH: 29.4 pg (ref 26.0–34.0)
MCHC: 32.9 g/dL (ref 30.0–36.0)
MCV: 89.6 fL (ref 80.0–100.0)
Platelets: 335 10*3/uL (ref 150–400)
RBC: 4.72 MIL/uL (ref 3.87–5.11)
RDW: 12.8 % (ref 11.5–15.5)
WBC: 13.4 10*3/uL — ABNORMAL HIGH (ref 4.0–10.5)
nRBC: 0 % (ref 0.0–0.2)

## 2021-12-16 LAB — COMPREHENSIVE METABOLIC PANEL
ALT: 27 U/L (ref 0–44)
AST: 23 U/L (ref 15–41)
Albumin: 3.9 g/dL (ref 3.5–5.0)
Alkaline Phosphatase: 122 U/L (ref 38–126)
Anion gap: 10 (ref 5–15)
BUN: 16 mg/dL (ref 6–20)
CO2: 21 mmol/L — ABNORMAL LOW (ref 22–32)
Calcium: 9.7 mg/dL (ref 8.9–10.3)
Chloride: 108 mmol/L (ref 98–111)
Creatinine, Ser: 0.63 mg/dL (ref 0.44–1.00)
GFR, Estimated: >60 mL/min (ref >60)
Glucose, Bld: 100 mg/dL — ABNORMAL HIGH (ref 70–99)
Potassium: 3.9 mmol/L (ref 3.5–5.1)
Sodium: 139 mmol/L (ref 135–145)
Total Bilirubin: 0.4 mg/dL (ref 0.3–1.2)
Total Protein: 7.4 g/dL (ref 6.5–8.1)

## 2021-12-16 LAB — I-STAT BETA HCG BLOOD, ED (MC, WL, AP ONLY): I-stat hCG, quantitative: <5 m[IU]/mL (ref <5)

## 2021-12-16 LAB — TROPONIN I (HIGH SENSITIVITY): Troponin I (High Sensitivity): 4 ng/L (ref <18)

## 2021-12-16 NOTE — ED Triage Notes (Addendum)
WRONG PATIENT

## 2021-12-16 NOTE — ED Triage Notes (Signed)
C/o upper gas pain x 1 day. She reports being very gassy today. ?

## 2021-12-16 NOTE — Discharge Instructions (Signed)
Go to the emergency department as soon as you leave urgent care for further evaluation and management. 

## 2021-12-16 NOTE — ED Triage Notes (Signed)
Pt with chest pressure with deep inspiration. Radiates to left shoulder blade/neck area.  Nothing makes it better or worse.  Also endorses some "gas pains"  ?

## 2021-12-16 NOTE — ED Provider Notes (Signed)
?[facility] ? ? ? ?CSN: [id number] ?Arrival date & time: 12/16/21  1907 ? ? ?  ? ?History   ?Chief Complaint ?Chief Complaint  ?Patient presents with  ? Abdominal Pain  ?  Entered by patient  ? ? ?HPI ?Sabrina Jones is a 46 y.o. female.  ? ?Patient presents with left-sided chest pain and pressure that radiates to left shoulder that started this morning upon awakening.  Patient reports that the pain sometimes radiates to right side abdomen and down to right lower abdomen.  Denies any associated nausea, vomiting, diarrhea, constipation.  Patient having regular bowel movements.  Denies associated fevers.  No aggravating or relieving factors to pain.  Denies any relevant cardiac history.  Discomfort is constant and is described as a "gassy pain". Denies blood in stool. Patient has had gallbladder removed.  Denies any associated headache, dizziness, blurred vision, shortness of breath. Patient attributing symptoms to "gas pains".  ? ? ?Abdominal Pain ? ?Past Medical History:  ?Diagnosis Date  ? B12 deficiency   ? Depression   ? Fatty liver   ? High cholesterol   ? HTN (hypertension)   ? Infertility, female   ? PCOS (polycystic ovarian syndrome)   ? Prediabetes   ? Vitamin D deficiency   ? ? ?There are no problems to display for this patient. ? ? ?Past Surgical History:  ?Procedure Laterality Date  ? ANKLE RECONSTRUCTION Right 1995  ? age 64  ? DILATION AND EVACUATION  06/2011  ? GALLBLADDER SURGERY  2005  ? TONSILLECTOMY    ? age 34  ? WISDOM TOOTH EXTRACTION  1997  ? age 9  ? ? ?OB History   ? ? Gravida  ?2  ? Para  ?   ? Term  ?   ? Preterm  ?   ? AB  ?   ? Living  ?1  ?  ? ? SAB  ?   ? IAB  ?   ? Ectopic  ?   ? Multiple  ?   ? Live Births  ?   ?   ?  ?  ? ? ? ?Home Medications   ? ?Prior to Admission medications   ?Medication Sig Start Date End Date Taking? Authorizing Provider  ?acetaminophen (TYLENOL) 500 MG tablet Take 500 mg by mouth every 6 (six) hours as needed. 250-500mg     [provider]   ?ergocalciferol (VITAMIN D2) 1.25 MG (50000 UT) capsule Take 1 capsule (50,000 Units total) by mouth once a week. 12/11/21 01/10/22  Quillian Quince D, MD  ?MAGNESIUM PO Take 150 mg by mouth. Magnesium w/ B6 (5mg )    [provider]  ?Multiple Vitamins-Minerals (MULTIVITAMIN WOMEN PO) Take by mouth.    [provider]  ?nebivolol (BYSTOLIC) 5 MG tablet Take 1 tablet (5 mg total) by mouth daily. 12/11/21   12/13/21 D, MD  ?rosuvastatin (CRESTOR) 5 MG tablet Take 5 mg by mouth daily.    [provider]  ?valsartan (DIOVAN) 160 MG tablet Take 160 mg by mouth daily.    [provider]  ? ? ?Family History ?Family History  ?Problem Relation Age of Onset  ? Cancer Mother   ? Kidney disease Mother   ? Sleep apnea Mother   ? Obesity Mother   ? Obesity Father   ? Kidney disease Father   ? Stroke Father   ? Hyperlipidemia Father   ? Hypertension Father   ? Diabetes Father   ?  Heart disease Father   ? Alcoholism Father   ? ? ?Social History ?Social History  ? ?Tobacco Use  ? Smoking status: Never  ? Smokeless tobacco: Never  ? ? ? ?Allergies   ?Patient has no allergy information on record. ? ? ?Review of Systems ?Review of Systems ?Per HPI ? ?Physical Exam ?Triage Vital Signs ?ED Triage Vitals [12/16/21 1916]  ?Enc Vitals Group  ?   BP (!) 138/111  ?   Pulse Rate 92  ?   Resp 16  ?   Temp 97.9 ?F (36.6 ?C)  ?   Temp Source Oral  ?   SpO2 97 %  ?   Weight   ?   Height   ?   Head Circumference   ?   Peak Flow   ?   Pain Score   ?   Pain Loc   ?   Pain Edu?   ?   Excl. in GC?   ? ?No data found. ? ?Updated Vital Signs ?BP (!) 138/111 (BP Location: Left Arm)   Pulse 92   Temp 97.9 ?F (36.6 ?C) (Oral)   Resp 16   LMP 12/02/2021   SpO2 97%  ? ?Visual Acuity ?Right Eye Distance:   ?Left Eye Distance:   ?Bilateral Distance:   ? ?Right Eye Near:   ?Left Eye Near:    ?Bilateral Near:    ? ?Physical Exam ?Constitutional:   ?   General: She is not in acute distress. ?   Appearance: Normal  appearance. She is not toxic-appearing or diaphoretic.  ?HENT:  ?   Head: Normocephalic and atraumatic.  ?Eyes:  ?   Extraocular Movements: Extraocular movements intact.  ?   Conjunctiva/sclera: Conjunctivae normal.  ?Cardiovascular:  ?   Rate and Rhythm: Normal rate and regular rhythm.  ?   Pulses: Normal pulses.  ?   Heart sounds: Normal heart sounds.  ?Pulmonary:  ?   Effort: Pulmonary effort is normal. No respiratory distress.  ?   Breath sounds: Normal breath sounds.  ?Chest:  ?   Chest wall: No tenderness.  ?Abdominal:  ?   General: Bowel sounds are normal. There is no distension.  ?   Palpations: Abdomen is soft.  ?   Tenderness: There is no abdominal tenderness.  ?Neurological:  ?   General: No focal deficit present.  ?   Mental Status: She is alert and oriented to person, place, and time. Mental status is at baseline.  ?Psychiatric:     ?   Mood and Affect: Mood normal.     ?   Behavior: Behavior normal.     ?   Thought Content: Thought content normal.     ?   Judgment: Judgment normal.  ? ? ? ?UC Treatments / Results  ?Labs ?(all labs ordered are listed, but only abnormal results are displayed) ?Labs Reviewed - No data to display ? ?EKG ? ? ?Radiology ?No results found. ? ?Procedures ?Procedures (including critical care time) ? ?Medications Ordered in UC ?Medications - No data to display ? ?Initial Impression / Assessment and Plan / UC Course  ?I have reviewed the triage vital signs and the nursing notes. ? ?Pertinent labs & imaging results that were available during my care of the patient were reviewed by me and considered in my medical decision making (see chart for details). ? ?  ? ?EKG was completed that showed sinus rhythm.  There is concern given chest pain/pressure with radiation to  the left arm for cardiac etiology.  Other differential diagnoses include GERD.  I do think that worrisome etiologies need to be ruled out.  Limited resources here at the urgent care to rule out cardiac etiology out.   Patient was advised to go to the ER for further evaluation and management.  Patient left via self transport as vital signs and EKG were stable. ?Final Clinical Impressions(s) / UC Diagnoses  ? ?Final diagnoses:  ?Other chest pain  ? ? ? ?Discharge Instructions   ? ?  ?Go to the emergency department as soon as you leave urgent care for further evaluation and management. ? ? ? ?ED Prescriptions   ?None ?  ? ?PDMP not reviewed this encounter. ?  ?Gustavus BryantMound, Mayce Noyes E, [place]FNP ?12/16/21 1937 ? ?

## 2021-12-16 NOTE — ED Provider Triage Note (Signed)
Emergency Medicine Provider Triage Evaluation Note ? ?Sabrina Jones , a 46 y.o. female  was evaluated in triage.  Pt complains of several days of chest pressure and pain with inspiration.  Also notes some increased urinary frequency and mild constipation.  Denies cough, fever, recent upper respiratory infection.  Denies nausea, vomiting, diarrhea.  No significant cardiac history.  Hx of cholecystectomy.  Denies chance of pregnancy. ? ?Review of Systems  ?Positive: As above ?Negative: As above ? ?Physical Exam  ?BP (!) 146/92 (BP Location: Left Arm)   Pulse 98   Temp 98.9 ?F (37.2 ?C) (Oral)   Resp 20   LMP 12/02/2021   SpO2 98%  ?Gen:   Awake, no distress   ?Resp:  Normal effort CTAB ?MSK:   Moves extremities without difficulty  ?Other:  Abdomen soft nontender.  Radial pulses 2+ bilaterally.  Chest non-TTP. ? ?Medical Decision Making  ?Medically screening exam initiated at 8:37 PM.  Appropriate orders placed.  Sabrina Jones was informed that the remainder of the evaluation will be completed by another provider, this initial triage assessment does not replace that evaluation, and the importance of remaining in the ED until their evaluation is complete. ? ?Labs, imaging, EKG ?  ?Cecil Cobbs, PA-C ?12/16/21 2046 ? ?

## 2021-12-17 LAB — TROPONIN I (HIGH SENSITIVITY): Troponin I (High Sensitivity): 5 ng/L (ref <18)

## 2021-12-17 NOTE — Discharge Instructions (Signed)
Your workup today was very reassuring. Your chest xray, ekg and labs were all normal and there is no evidence of a problem with your heart. Given your history, I suspect that these are gas related pains. Since you have improvement with symptoms with psyllium fiber and probiotic, feel free to start these again. I have also sent you in a prescription for an antacid. Begin by taking this just as needed, if symptoms continue, you can increase to daily. Once you establish care with a primary care doctor, they will be able to help you manage this long term as well.  ?

## 2021-12-17 NOTE — ED Provider Notes (Signed)
?[facility] ?Provider Note ? ? ?CSN: [id number] ?Arrival date & time: 12/16/21  1956 ? ?  ? ?History ? ?Chief Complaint  ?Patient presents with  ? Chest Pain  ? ? ?Sabrina PickingKristi C Jones is a 46 y.o. female with history of hypertension, hyperlipidemia, insulin resistance who presents to the ED for evaluation of left-sided chest pain radiating to the left shoulder that began yesterday morning upon waking.  Patient describes it as a "gassy pain", and reports previous history of gas pains that are usually resolved with psyllium fiber and probiotics.  Patient initially went to urgent care for her symptoms, however they referred her to the ED for cardiac evaluation.  Patient has previous history of cholecystectomy, no previous cardiac history.  She denies shortness of breath, abdominal pain, nausea, vomiting, diarrhea. ? ? ?Chest Pain ? ?  ? ?Home Medications ?Prior to Admission medications   ?Medication Sig Start Date End Date Taking? Authorizing Provider  ?acetaminophen (TYLENOL) 500 MG tablet Take 1,000 mg by mouth every 6 (six) hours as needed for moderate pain or headache. 250-500mg    Yes [provider]  ?ergocalciferol (VITAMIN D2) 1.25 MG (50000 UT) capsule Take 1 capsule (50,000 Units total) by mouth once a week. ?Patient taking differently: Take 50,000 Units by mouth every Thursday. 12/11/21 01/10/22 Yes Beasley, Caren D, MD  ?fluticasone (FLONASE) 50 MCG/ACT nasal spray Place 1 spray into both nostrils daily as needed for allergies or rhinitis.   Yes [provider]  ?MAGNESIUM PO Take 150 mg by mouth at bedtime as needed (sleep). Magnesium w/ B6 (5mg )   Yes [provider]  ?Multiple Vitamins-Minerals (MULTIVITAMIN WOMEN PO) Take 1 tablet by mouth daily. gummy   Yes [provider]  ?nebivolol (BYSTOLIC) 5 MG tablet Take 1 tablet (5 mg total) by mouth daily. ?Patient taking differently: Take 5 mg by mouth in the morning and at bedtime. 12/11/21   Yes Beasley, Caren D, MD  ?Psyllium (METAMUCIL PO) Take 1 Scoop by mouth daily as needed (constipation).   Yes [provider]  ?rosuvastatin (CRESTOR) 5 MG tablet Take 5 mg by mouth daily.   Yes [provider]  ?valsartan (DIOVAN) 160 MG tablet Take 160 mg by mouth daily.   Yes [provider]  ?   ? ?Allergies    ?Patient has no known allergies.   ? ?Review of Systems   ?Review of Systems  ?Cardiovascular:  Positive for chest pain.  ? ?Physical Exam ?Updated Vital Signs ?BP 125/75 (BP Location: Left Arm)   Pulse 76   Temp 98.4 ?F (36.9 ?C) (Oral)   Resp 16   LMP 12/02/2021   SpO2 99%  ?Physical Exam ?Vitals and nursing note reviewed.  ?Constitutional:   ?   General: She is not in acute distress. ?   Appearance: She is not ill-appearing.  ?HENT:  ?   Head: Atraumatic.  ?Eyes:  ?   Conjunctiva/sclera: Conjunctivae normal.  ?Cardiovascular:  ?   Rate and Rhythm: Normal rate and regular rhythm.  ?   Pulses: Normal pulses.  ?   Heart sounds: No murmur heard. ?Pulmonary:  ?   Effort: Pulmonary effort is normal. No respiratory distress.  ?   Breath sounds: Normal breath sounds.  ?   Comments: Speaking in full complete sentences ?Abdominal:  ?   General: Abdomen is flat. There is no distension.  ?   Palpations: Abdomen is soft.  ?   Tenderness: There is no abdominal  tenderness.  ?Musculoskeletal:     ?   General: Normal range of motion.  ?   Cervical back: Normal range of motion.  ?Skin: ?   General: Skin is warm and dry.  ?   Capillary Refill: Capillary refill takes less than 2 seconds.  ?Neurological:  ?   General: No focal deficit present.  ?   Mental Status: She is alert.  ?Psychiatric:     ?   Mood and Affect: Mood normal.  ? ? ?ED Results / Procedures / Treatments   ?Labs ?(all labs ordered are listed, but only abnormal results are displayed) ?Labs Reviewed  ?CBC - Abnormal; Notable for the following components:  ?    Result Value  ? WBC 13.4 (*)   ? All other components within  normal limits  ?COMPREHENSIVE METABOLIC PANEL - Abnormal; Notable for the following components:  ? CO2 21 (*)   ? Glucose, Bld 100 (*)   ? All other components within normal limits  ?I-STAT BETA HCG BLOOD, ED ([facility], [facility], [facility] ONLY)  ?I-STAT BETA HCG BLOOD, ED ([facility], [facility], [facility] ONLY)  ?TROPONIN I (HIGH SENSITIVITY)  ?TROPONIN I (HIGH SENSITIVITY)  ? ? ?EKG ?EKG Interpretation ? ?Date/Time:  Wednesday Dec 17 2021 06:16:03 EDT ?Ventricular Rate:  78 ?PR Interval:  158 ?QRS Duration: 88 ?QT Interval:  376 ?QTC Calculation: 428 ?R Axis:   65 ?Text Interpretation: Normal sinus rhythm Normal ECG No significant change since last tracing Confirmed by Zadie Rhine ([id number]) on 12/17/2021 6:30:05 AM ? ?Radiology ?DG Chest 2 View ? ?Result Date: 12/16/2021 ?CLINICAL DATA:  Mid chest pain EXAM: CHEST - 2 VIEW COMPARISON:  06/17/2006 FINDINGS: The heart size and mediastinal contours are within normal limits. Both lungs are clear. The visualized skeletal structures are unremarkable. IMPRESSION: No active cardiopulmonary disease. Electronically Signed   By: Alcide Clever M.D.   On: 12/16/2021 21:21   ? ?Procedures ?Procedures  ? ? ?Medications Ordered in ED ?Medications - No data to display ? ?ED Course/ Medical Decision Making/ A&P ?  ?                        ?Medical Decision Making ?Amount and/or Complexity of Data Reviewed ?Labs: ordered. ?Radiology: ordered. ?ECG/medicine tests: ordered. ? ? ?History:  ?Per HPI ?Social determinants of health:  ?Social History  ? ?Socioeconomic History  ? Marital status: Married  ?  Spouse name: Not on file  ? Number of children: Not on file  ? Years of education: Not on file  ? Highest education level: Not on file  ?Occupational History  ? Occupation: Stay at home  ?Tobacco Use  ? Smoking status: Never  ? Smokeless tobacco: Never  ?Substance and Sexual Activity  ? Alcohol use: Not on file  ? Drug use: Not on file  ? Sexual activity: Not on file  ?Other Topics Concern  ? Not on file  ?Social History  Narrative  ? Not on file  ? ?Social Determinants of Health  ? ?Financial Resource Strain: Not on file  ?Food Insecurity: Not on file  ?Transportation Needs: Not on file  ?Physical Activity: Not on file  ?Stress: Not on file  ?Social Connections: Not on file  ?Intimate Partner Violence: Not on file  ? ? ? ?Initial impression: ? ?This patient presents to the ED for concern of left-sided chest pain radiating into the left shoulder that began yesterday morning. This involves an extensive number of treatment options, and  is a complaint that carries with it a high risk of complications and morbidity.   Emergent differentials include ACS/non-STEMI, PE, pneumothorax, GERD, musculoskeletal, pneumonia ? ? ?Lab Tests and EKG: ? ?I Ordered, reviewed, and interpreted labs and EKG.  The pertinent results include:  ?CMP unremarkable, negative pregnancy ?Delta troponin normal, CBC with leukocytosis of 13.4 ?EKG with normal sinus rhythm, no STE ? ? ?Imaging Studies ordered: ? ?I ordered imaging studies including  ?Chest x-ray without acute findings ?I independently visualized and interpreted imaging and I agree with the radiologist interpretation.  ? ? ?ED Course: ?Patient has no acute distress, nontoxic-appearing.  Satting 100% on room air.  Vitals without significant abnormalities.  Physical exam otherwise benign.  She is nontachycardic which lowers concern for acute PE.  EKG and labs rule out ACS/non-STEMI.  Patient is not short of breath, chest x-ray without acute findings.  No reproducible chest wall pain.   Delta troponin reassuring.  Labs are all normal aside from leukocytosis to 13.4.  When I asked patient, she did say that she feels that she may have been developing cold symptoms recently which would account for this.  Given the patient has previous history of GERD, no previous cardiac history and has reassuring cardiac work-up I feel she is stable for discharge at this time.  Discussed lab and imaging with patient who  expresses understanding is amenable to plan.. ? ?Disposition: ? ?After consideration of the diagnostic results, physical exam, history and the patients response to treatment feel that the patent would benefit from discharge.

## 2021-12-24 NOTE — Progress Notes (Signed)
Chief Complaint:   OBESITY Sabrina Jones is here to discuss her progress with her obesity treatment plan along with follow-up of her obesity related diagnoses. Sabrina Jones is on the Category 3 Plan + 100 calories and states she is following her eating plan approximately 90% of the time. Sabrina Jones states she is walking 10,000 steps 5 times per week.  Today's visit was #: 3 Starting weight: 273 lbs Starting date: 11/11/2021 Today's weight: 272 lbs Today's date: 12/10/2021 Total lbs lost to date: 1 Total lbs lost since last in-office visit: 0  Interim History: Sabrina Jones has done well maintaining her weight loss despite traveling since her last visit. She did very well avoiding weight gain. She is getting back on track, but she is getting bored with dinner and would like more options.   Subjective:   1. Essential hypertension Sabrina Jones's blood pressure is well controlled on medications. She is doing well with healthy eating and goal is to continue with diet and decreased medications.   2. Vitamin D deficiency Sabrina Jones's Vitamin D level is below goal. She is on Vitamin D prescription.   3. At risk for impaired metabolic function Sabrina Jones is at increased risk for impaired metabolic function if calories/protein decreases.   Assessment/Plan:   1. Essential hypertension We will refill Bystolic for 1 month. Sabrina Jones will continue working on healthy weight loss and exercise to improve blood pressure control. We will watch for signs of hypotension as she continues her lifestyle modifications.  - nebivolol (BYSTOLIC) 5 MG tablet; Take 1 tablet (5 mg total) by mouth daily. (Patient taking differently: Take 5 mg by mouth in the morning and at bedtime.)  Dispense: 30 tablet; Refill: 0  2. Vitamin D deficiency We will refill prescription Vitamin D for 1 month. We will recheck labs in 2 months. Sabrina Jones will follow-up for routine testing of Vitamin D, at least 2-3 times per year to avoid over-replacement.  -  ergocalciferol (VITAMIN D2) 1.25 MG (50000 UT) capsule; Take 1 capsule (50,000 Units total) by mouth once a week. (Patient taking differently: Take 50,000 Units by mouth every Thursday.)  Dispense: 4 capsule; Refill: 0  3. At risk for impaired metabolic function Sabrina Jones was given approximately 15 minutes of impaired  metabolic function prevention counseling today. We discussed intensive lifestyle modifications today with an emphasis on specific nutrition and exercise instructions and strategies.   Repetitive spaced learning was employed today to elicit superior memory formation and behavioral change.  4. Obesity, Current BMI 46.8 Sabrina Jones is currently in the action stage of change. As such, her goal is to continue with weight loss efforts. She has agreed to the Category 3 Plan and keeping a food journal and adhering to recommended goals of 400-600 calories and 40+ grams of protein at supper daily.   Exercise goals: As is.  Behavioral modification strategies: increasing lean protein intake and meal planning and cooking strategies.  Sabrina Jones has agreed to follow-up with our clinic in 2 weeks. She was informed of the importance of frequent follow-up visits to maximize her success with intensive lifestyle modifications for her multiple health conditions.   Objective:   Blood pressure 120/80, pulse 84, temperature 98 F (36.7 C), height 5\' 4"  (1.626 m), weight 272 lb (123.4 kg), last menstrual period 12/02/2021, SpO2 99 %. Body mass index is 46.69 kg/m.  General: Cooperative, alert, well developed, in no acute distress. HEENT: Conjunctivae and lids unremarkable. Cardiovascular: Regular rhythm.  Lungs: Normal work of breathing. Neurologic: No focal deficits.  Lab Results  Component Value Date   CREATININE 0.63 12/16/2021   BUN 16 12/16/2021   NA 139 12/16/2021   K 3.9 12/16/2021   CL 108 12/16/2021   CO2 21 (L) 12/16/2021   Lab Results  Component Value Date   ALT 27 12/16/2021   AST 23  12/16/2021   ALKPHOS 122 12/16/2021   BILITOT 0.4 12/16/2021   Lab Results  Component Value Date   HGBA1C 5.5 10/02/2021   No results found for: INSULIN Lab Results  Component Value Date   TSH 0.83 10/02/2021   Lab Results  Component Value Date   CHOL 162 10/02/2021   HDL 42 10/02/2021   LDLCALC 99 10/02/2021   TRIG 109 10/02/2021   Lab Results  Component Value Date   VD25OH 25.1 10/02/2021   Lab Results  Component Value Date   WBC 13.4 (H) 12/16/2021   HGB 13.9 12/16/2021   HCT 42.3 12/16/2021   MCV 89.6 12/16/2021   PLT 335 12/16/2021   Lab Results  Component Value Date   TIBC 255 10/02/2021   FERRITIN 235.0 10/02/2021   Attestation Statements:   Reviewed by clinician on day of visit: allergies, medications, problem list, medical history, surgical history, family history, social history, and previous encounter notes.   I, Burt Knack, am acting as transcriptionist for Quillian Quince, MD.  I have reviewed the above documentation for accuracy and completeness, and I agree with the above. -  Quillian Quince, MD

## 2021-12-25 ENCOUNTER — Encounter (INDEPENDENT_AMBULATORY_CARE_PROVIDER_SITE_OTHER): Payer: Self-pay

## 2021-12-25 ENCOUNTER — Ambulatory Visit (INDEPENDENT_AMBULATORY_CARE_PROVIDER_SITE_OTHER): Payer: 59 | Admitting: Family Medicine

## 2022-01-15 ENCOUNTER — Encounter (INDEPENDENT_AMBULATORY_CARE_PROVIDER_SITE_OTHER): Payer: Self-pay | Admitting: Family Medicine

## 2022-01-15 ENCOUNTER — Ambulatory Visit (INDEPENDENT_AMBULATORY_CARE_PROVIDER_SITE_OTHER): Payer: 59 | Admitting: Family Medicine

## 2022-01-15 VITALS — BP 120/74 | HR 77 | Temp 98.4°F | Ht 64.0 in | Wt 271.0 lb

## 2022-01-15 DIAGNOSIS — Z6841 Body Mass Index (BMI) 40.0 and over, adult: Secondary | ICD-10-CM

## 2022-01-15 DIAGNOSIS — E669 Obesity, unspecified: Secondary | ICD-10-CM | POA: Diagnosis not present

## 2022-01-15 DIAGNOSIS — I10 Essential (primary) hypertension: Secondary | ICD-10-CM | POA: Diagnosis not present

## 2022-01-15 DIAGNOSIS — E7849 Other hyperlipidemia: Secondary | ICD-10-CM | POA: Diagnosis not present

## 2022-01-15 MED ORDER — VALSARTAN 160 MG PO TABS: 160.0000 mg | ORAL_TABLET | Freq: Every day | ORAL | 0 refills | Status: DC

## 2022-01-15 MED ORDER — ROSUVASTATIN CALCIUM 5 MG PO TABS: 5.0000 mg | ORAL_TABLET | Freq: Every day | ORAL | 0 refills | Status: DC

## 2022-01-21 NOTE — Progress Notes (Signed)
Chief Complaint:   OBESITY Sabrina Jones is here to discuss her progress with her obesity treatment plan along with follow-up of her obesity related diagnoses. Sabrina Jones is on the Category 3 Plan and keeping a food journal and adhering to recommended goals of 400-600 calories and 40 grams of protein and states she is following her eating plan approximately 60% of the time. Sabrina Jones states she is walking 30-60 minutes 7 times per week.  Today's visit was #: 4 Starting weight: 273 lbs Starting date: 11/11/2021 Today's weight: 271 lbs Today's date: 01/15/2022 Total lbs lost to date: 2 lbs Total lbs lost since last in-office visit: 1  Interim History: Sabrina Jones had GI bug and episode of chest pain and travel over the last month. She wants to recommit to plan now. She was trying to stay conscious of food choices. No plans for the next few weeks.  Subjective:   1. Essential hypertension Sabrina Jones's blood pressure is well controlled today. Denies chest pain, chest pressure and headache.  2. Other hyperlipidemia Sabrina Jones is on Rosuvastatin with no myalgias or transaminitis  Assessment/Plan:   1. Essential hypertension We will refill Diovan 160 mg by mouth daily for 1 month with 0 refills.  -Refill valsartan (DIOVAN) 160 MG tablet; Take 1 tablet (160 mg total) by mouth daily.  Dispense: 30 tablet; Refill: 0  2. Other hyperlipidemia We will refill Rosuvastatin 5 mg daily for 1 month with 0 refills.  -Refill rosuvastatin (CRESTOR) 5 MG tablet; Take 1 tablet (5 mg total) by mouth daily.  Dispense: 30 tablet; Refill: 0  3. Obesity, Current BMI 46.5 Sabrina Jones is currently in the action stage of change. As such, her goal is to continue with weight loss efforts. She has agreed to the Category 3 Plan and keeping a food journal and adhering to recommended goals of 400-600 calories and 40+ grams of protein at supper.  Exercise goals: As is.  Behavioral modification strategies: increasing lean protein intake,  meal planning and cooking strategies, keeping healthy foods in the home, and planning for success.  Sabrina Jones has agreed to follow-up with our clinic in 3 weeks. She was informed of the importance of frequent follow-up visits to maximize her success with intensive lifestyle modifications for her multiple health conditions.   Objective:   Blood pressure 120/74, pulse 77, temperature 98.4 F (36.9 C), height 5\' 4"  (1.626 m), weight 271 lb (122.9 kg), SpO2 95 %. Body mass index is 46.52 kg/m.  General: Cooperative, alert, well developed, in no acute distress. HEENT: Conjunctivae and lids unremarkable. Cardiovascular: Regular rhythm.  Lungs: Normal work of breathing. Neurologic: No focal deficits.   Lab Results  Component Value Date   CREATININE 0.63 12/16/2021   BUN 16 12/16/2021   NA 139 12/16/2021   K 3.9 12/16/2021   CL 108 12/16/2021   CO2 21 (L) 12/16/2021   Lab Results  Component Value Date   ALT 27 12/16/2021   AST 23 12/16/2021   ALKPHOS 122 12/16/2021   BILITOT 0.4 12/16/2021   Lab Results  Component Value Date   HGBA1C 5.5 10/02/2021   No results found for: INSULIN Lab Results  Component Value Date   TSH 0.83 10/02/2021   Lab Results  Component Value Date   CHOL 162 10/02/2021   HDL 42 10/02/2021   LDLCALC 99 10/02/2021   TRIG 109 10/02/2021   Lab Results  Component Value Date   VD25OH 25.1 10/02/2021   Lab Results  Component Value Date   WBC  13.4 (H) 12/16/2021   HGB 13.9 12/16/2021   HCT 42.3 12/16/2021   MCV 89.6 12/16/2021   PLT 335 12/16/2021   Lab Results  Component Value Date   TIBC 255 10/02/2021   FERRITIN 235.0 10/02/2021   Attestation Statements:   Reviewed by clinician on day of visit: allergies, medications, problem list, medical history, surgical history, family history, social history, and previous encounter notes.  I, Elnora Morrison, RMA am acting as transcriptionist for Coralie Common, MD.  I have reviewed the above  documentation for accuracy and completeness, and I agree with the above. - Coralie Common, MD

## 2022-02-05 ENCOUNTER — Encounter (INDEPENDENT_AMBULATORY_CARE_PROVIDER_SITE_OTHER): Payer: Self-pay | Admitting: Family Medicine

## 2022-02-05 ENCOUNTER — Ambulatory Visit (INDEPENDENT_AMBULATORY_CARE_PROVIDER_SITE_OTHER): Payer: 59 | Admitting: Family Medicine

## 2022-02-05 VITALS — BP 144/78 | HR 78 | Temp 98.0°F | Ht 64.0 in | Wt 277.0 lb

## 2022-02-05 DIAGNOSIS — E7849 Other hyperlipidemia: Secondary | ICD-10-CM

## 2022-02-05 DIAGNOSIS — E559 Vitamin D deficiency, unspecified: Secondary | ICD-10-CM

## 2022-02-05 DIAGNOSIS — E669 Obesity, unspecified: Secondary | ICD-10-CM | POA: Diagnosis not present

## 2022-02-05 DIAGNOSIS — I10 Essential (primary) hypertension: Secondary | ICD-10-CM | POA: Diagnosis not present

## 2022-02-05 DIAGNOSIS — Z6841 Body Mass Index (BMI) 40.0 and over, adult: Secondary | ICD-10-CM

## 2022-02-05 MED ORDER — VITAMIN D (ERGOCALCIFEROL) 1.25 MG (50000 UNIT) PO CAPS: 50000.0000 [IU] | ORAL_CAPSULE | ORAL | 0 refills | Status: DC

## 2022-02-05 MED ORDER — VALSARTAN 160 MG PO TABS: 160.0000 mg | ORAL_TABLET | Freq: Every day | ORAL | 0 refills | Status: DC

## 2022-02-05 MED ORDER — ROSUVASTATIN CALCIUM 5 MG PO TABS: 5.0000 mg | ORAL_TABLET | Freq: Every day | ORAL | 0 refills | Status: DC

## 2022-02-05 MED ORDER — NEBIVOLOL HCL 5 MG PO TABS: 5.0000 mg | ORAL_TABLET | Freq: Every day | ORAL | 0 refills | Status: DC

## 2022-02-10 NOTE — Progress Notes (Signed)
Chief Complaint:   OBESITY Sabrina Jones is here to discuss her progress with her obesity treatment plan along with follow-up of her obesity related diagnoses. Sabrina Jones is on the Category 3 Plan, 400-600 cal at supper, 40+ grams of protein and states she is following her eating plan approximately 80% of the time. Sabrina Jones states she is swimming and walking 30-60 minutes 2-3 times per week.  Today's visit was #: 5 Starting weight: 273 lbs Starting date: 11/11/2021 Today's weight: 277 lbs Today's date: 02/05/2022 Total lbs lost to date: 0 lbs Total lbs lost since last in-office visit: 0  Interim History: Sabrina Jones has had more travel then she wanted. She recognizes she still categorizes food as good or bad. She is starting to realize there is still room for improvement. The next few weeks she has no upcoming plans for travel or events. Food is in house for meal plan.  Subjective:   1. Essential hypertension Sabrina Jones's blood pressure is slightly elevated today. Denies chest pain, chest pressure and headache.  2. Other hyperlipidemia Sabrina Jones is doing well on Crestor. With no myalgias or transaminitis.  3. Vitamin D deficiency Sabrina Jones is currently taking prescription Vit D 50,000 IU once a week. She is doing well. Denies any nausea, vomiting or muscle weakness. She notes fatigue.  Assessment/Plan:   1. Essential hypertension We will refill Bystolic 5 mg by mouth daily AND refill Valsartan 160 mg by mouth daily for 1 month with 0 refills.  -Refill nebivolol (BYSTOLIC) 5 MG tablet; Take 1 tablet (5 mg total) by mouth daily.  Dispense: 90 tablet; Refill: 0  -Refill valsartan (DIOVAN) 160 MG tablet; Take 1 tablet (160 mg total) by mouth daily.  Dispense: 90 tablet; Refill: 0  2. Other hyperlipidemia We will refill Crestor 5 mg by mouth daily for 1 month with 0 refills.  -Refill rosuvastatin (CRESTOR) 5 MG tablet; Take 1 tablet (5 mg total) by mouth daily.  Dispense: 90 tablet; Refill: 0  3.  Vitamin D deficiency We will refill Vit D 50,000 IU once a week for 1 month with 0 refills.  Refill Vitamin D, Ergocalciferol, (DRISDOL) 1.25 MG (50000 UNIT) CAPS capsule; Take 1 capsule (50,000 Units total) by mouth every 7 (seven) days.  Dispense: 4 capsule; Refill: 0  4. Obesity, Current BMI 47.7 Sabrina Jones is currently in the action stage of change. As such, her goal is to continue with weight loss efforts. She has agreed to the Category 3 Plan.   Exercise goals: All adults should avoid inactivity. Some physical activity is better than none, and adults who participate in any amount of physical activity gain some health benefits.  Behavioral modification strategies: increasing lean protein intake, meal planning and cooking strategies, keeping healthy foods in the home, and planning for success.  Sabrina Jones has agreed to follow-up with our clinic in 3 weeks. She was informed of the importance of frequent follow-up visits to maximize her success with intensive lifestyle modifications for her multiple health conditions.   Objective:   Blood pressure (!) 144/78, pulse 78, temperature 98 F (36.7 C), height 5\' 4"  (1.626 m), weight 277 lb (125.6 kg), SpO2 96 %. Body mass index is 47.55 kg/m.  General: Cooperative, alert, well developed, in no acute distress. HEENT: Conjunctivae and lids unremarkable. Cardiovascular: Regular rhythm.  Lungs: Normal work of breathing. Neurologic: No focal deficits.   Lab Results  Component Value Date   CREATININE 0.63 12/16/2021   BUN 16 12/16/2021   NA 139 12/16/2021  K 3.9 12/16/2021   CL 108 12/16/2021   CO2 21 (L) 12/16/2021   Lab Results  Component Value Date   ALT 27 12/16/2021   AST 23 12/16/2021   ALKPHOS 122 12/16/2021   BILITOT 0.4 12/16/2021   Lab Results  Component Value Date   HGBA1C 5.5 10/02/2021   No results found for: "INSULIN" Lab Results  Component Value Date   TSH 0.83 10/02/2021   Lab Results  Component Value Date   CHOL  162 10/02/2021   HDL 42 10/02/2021   LDLCALC 99 10/02/2021   TRIG 109 10/02/2021   Lab Results  Component Value Date   VD25OH 25.1 10/02/2021   Lab Results  Component Value Date   WBC 13.4 (H) 12/16/2021   HGB 13.9 12/16/2021   HCT 42.3 12/16/2021   MCV 89.6 12/16/2021   PLT 335 12/16/2021   Lab Results  Component Value Date   TIBC 255 10/02/2021   FERRITIN 235.0 10/02/2021   Attestation Statements:   Reviewed by clinician on day of visit: allergies, medications, problem list, medical history, surgical history, family history, social history, and previous encounter notes.  I, Fortino Sic, RMA am acting as transcriptionist for Reuben Likes, MD.  I have reviewed the above documentation for accuracy and completeness, and I agree with the above. - Reuben Likes, MD

## 2022-03-02 ENCOUNTER — Telehealth (INDEPENDENT_AMBULATORY_CARE_PROVIDER_SITE_OTHER): Payer: 59 | Admitting: Family Medicine

## 2022-03-02 ENCOUNTER — Encounter (INDEPENDENT_AMBULATORY_CARE_PROVIDER_SITE_OTHER): Payer: Self-pay | Admitting: Family Medicine

## 2022-03-02 DIAGNOSIS — I10 Essential (primary) hypertension: Secondary | ICD-10-CM | POA: Diagnosis not present

## 2022-03-02 DIAGNOSIS — E559 Vitamin D deficiency, unspecified: Secondary | ICD-10-CM | POA: Diagnosis not present

## 2022-03-02 DIAGNOSIS — Z6841 Body Mass Index (BMI) 40.0 and over, adult: Secondary | ICD-10-CM

## 2022-03-02 DIAGNOSIS — E669 Obesity, unspecified: Secondary | ICD-10-CM | POA: Diagnosis not present

## 2022-03-02 MED ORDER — VITAMIN D (ERGOCALCIFEROL) 1.25 MG (50000 UNIT) PO CAPS: 50000.0000 [IU] | ORAL_CAPSULE | ORAL | 0 refills | Status: AC

## 2022-03-04 NOTE — Progress Notes (Signed)
TeleHealth Visit:  Due to the COVID-19 pandemic, this visit was completed with telemedicine (audio/video) technology to reduce patient and provider exposure as well as to preserve personal protective equipment.   Sabrina Jones has verbally consented to this TeleHealth visit. The patient is located at home, the provider is located at the [facility] office. The participants in this visit include the listed provider and patient. The visit was conducted today via Mychart Video.  Chief Complaint: OBESITY Sabrina Jones is here to discuss her progress with her obesity treatment plan along with follow-up of her obesity related diagnoses. Sabrina Jones is on the Category 3 Plan and states she is following her eating plan approximately 95% of the time. Sabrina Jones states she is swimming 60 minutes 2-3 times per week.  Today's visit was #: 6 Starting weight: 273 lbs Starting date: 11/11/2021  Interim History: Sabrina Jones is in car running out for medications for her daughter due to a GI bug. Mostly same life routine and schedule over last few weeks. Foodwise she is trying to stay mindful of food choices. She has been trying to continuously plan and prep food at home. She is going to beach and then planning overseas trip to [place] and [place] (leaving Aug 16 to the end of Sept).  Subjective:   1. Vitamin D deficiency Sabrina Jones is currently taking prescription Vit D 50,000 IU once a week. Denies any nausea, vomiting or muscle weakness. She notes fatigue.  2. Essential hypertension Sabrina Jones blood pressure is well controlled at prior appointments. Denies chest pain, chest pressure and headache.  Assessment/Plan:   1. Vitamin D deficiency We will refill Vit D 50,000 IU once a week for 1 month with 0 refills.  -Refill Vitamin D, Ergocalciferol, (DRISDOL) 1.25 MG (50000 UNIT) CAPS capsule; Take 1 capsule (50,000 Units total) by mouth every 7 (seven) days.  Dispense: 4 capsule; Refill: 0  2. Essential hypertension Sabrina Jones  will continue current medications without changes in dose. No refills given.  3. Obesity, Current BMI 47.7 Sabrina Jones is currently in the action stage of change. As such, her goal is to continue with weight loss efforts. She has agreed to the Category 3 Plan.   Exercise goals: All adults should avoid inactivity. Some physical activity is better than none, and adults who participate in any amount of physical activity gain some health benefits.  Behavioral modification strategies: increasing lean protein intake, meal planning and cooking strategies, and keeping healthy foods in the home.  Sabrina Jones has agreed to follow-up with our clinic in 4 weeks. She was informed of the importance of frequent follow-up visits to maximize her success with intensive lifestyle modifications for her multiple health conditions.  Objective:   VITALS: Per patient if applicable, see vitals. GENERAL: Alert and in no acute distress. CARDIOPULMONARY: No increased WOB. Speaking in clear sentences.  PSYCH: Pleasant and cooperative. Speech normal rate and rhythm. Affect is appropriate. Insight and judgement are appropriate. Attention is focused, linear, and appropriate.  NEURO: Oriented as arrived to appointment on time with no prompting.   Lab Results  Component Value Date   CREATININE 0.63 12/16/2021   BUN 16 12/16/2021   NA 139 12/16/2021   K 3.9 12/16/2021   CL 108 12/16/2021   CO2 21 (L) 12/16/2021   Lab Results  Component Value Date   ALT 27 12/16/2021   AST 23 12/16/2021   ALKPHOS 122 12/16/2021   BILITOT 0.4 12/16/2021   Lab Results  Component Value Date   HGBA1C 5.5  10/02/2021   No results found for: "INSULIN" Lab Results  Component Value Date   TSH 0.83 10/02/2021   Lab Results  Component Value Date   CHOL 162 10/02/2021   HDL 42 10/02/2021   LDLCALC 99 10/02/2021   TRIG 109 10/02/2021   Lab Results  Component Value Date   VD25OH 25.1 10/02/2021   Lab Results  Component Value Date   WBC  13.4 (H) 12/16/2021   HGB 13.9 12/16/2021   HCT 42.3 12/16/2021   MCV 89.6 12/16/2021   PLT 335 12/16/2021   Lab Results  Component Value Date   TIBC 255 10/02/2021   FERRITIN 235.0 10/02/2021   Attestation Statements:   Reviewed by clinician on day of visit: allergies, medications, problem list, medical history, surgical history, family history, social history, and previous encounter notes.  I, Fortino Sic, RMA am acting as transcriptionist for Reuben Likes, MD.  I have reviewed the above documentation for accuracy and completeness, and I agree with the above. - Reuben Likes, MD

## 2022-03-25 ENCOUNTER — Encounter (INDEPENDENT_AMBULATORY_CARE_PROVIDER_SITE_OTHER): Payer: Self-pay

## 2022-03-30 ENCOUNTER — Ambulatory Visit (INDEPENDENT_AMBULATORY_CARE_PROVIDER_SITE_OTHER): Payer: 59 | Admitting: Family Medicine

## 2022-05-05 ENCOUNTER — Other Ambulatory Visit (INDEPENDENT_AMBULATORY_CARE_PROVIDER_SITE_OTHER): Payer: Self-pay | Admitting: Family Medicine

## 2022-05-05 DIAGNOSIS — E7849 Other hyperlipidemia: Secondary | ICD-10-CM

## 2022-05-05 DIAGNOSIS — I10 Essential (primary) hypertension: Secondary | ICD-10-CM

## 2022-05-12 ENCOUNTER — Telehealth (INDEPENDENT_AMBULATORY_CARE_PROVIDER_SITE_OTHER): Payer: Self-pay | Admitting: Family Medicine

## 2022-05-12 NOTE — Telephone Encounter (Signed)
I have left patient vm to call me back to discuss billing statements.

## 2023-01-04 ENCOUNTER — Encounter: Payer: Self-pay | Admitting: Nurse Practitioner

## 2023-01-04 ENCOUNTER — Ambulatory Visit (INDEPENDENT_AMBULATORY_CARE_PROVIDER_SITE_OTHER): Payer: Commercial Managed Care - PPO | Admitting: Nurse Practitioner

## 2023-01-04 VITALS — BP 122/82 | HR 82 | Ht 64.0 in | Wt 288.0 lb

## 2023-01-04 DIAGNOSIS — E782 Mixed hyperlipidemia: Secondary | ICD-10-CM

## 2023-01-04 DIAGNOSIS — Z7689 Persons encountering health services in other specified circumstances: Secondary | ICD-10-CM

## 2023-01-04 DIAGNOSIS — E7849 Other hyperlipidemia: Secondary | ICD-10-CM | POA: Insufficient documentation

## 2023-01-04 DIAGNOSIS — K219 Gastro-esophageal reflux disease without esophagitis: Secondary | ICD-10-CM | POA: Insufficient documentation

## 2023-01-04 DIAGNOSIS — I10 Essential (primary) hypertension: Secondary | ICD-10-CM | POA: Diagnosis not present

## 2023-01-04 MED ORDER — OMEPRAZOLE 40 MG PO CPDR
40.0000 mg | DELAYED_RELEASE_CAPSULE | Freq: Every day | ORAL | 1 refills | Status: AC
Start: 2023-01-04 — End: ?

## 2023-01-04 MED ORDER — TELMISARTAN 80 MG PO TABS: 80.0000 mg | ORAL_TABLET | Freq: Every day | ORAL | 1 refills | Status: AC

## 2023-01-04 MED ORDER — ROSUVASTATIN CALCIUM 5 MG PO TABS: 5.0000 mg | ORAL_TABLET | Freq: Every day | ORAL | 1 refills | Status: AC

## 2023-01-04 MED ORDER — NEBIVOLOL HCL 5 MG PO TABS: 5.0000 mg | ORAL_TABLET | Freq: Every day | ORAL | 1 refills | Status: AC

## 2023-01-04 NOTE — Assessment & Plan Note (Signed)
Stable.  Continue current medication

## 2023-01-04 NOTE — Assessment & Plan Note (Signed)
Intermittent.  -may take omeprazole as needed and as prescribed

## 2023-01-04 NOTE — Progress Notes (Signed)
New Patient Office Visit  Subjective    Patient ID: Sabrina Jones, female    DOB: 06-14-1976  Age: 47 y.o. MRN: [id number]  CC:  Chief Complaint  Patient presents with   New Patient (Initial Visit)    HPI Sabrina Jones presents to establish care [place] help take care of her mother. -history of hypertension  -history of hyperlipidemia  -weight gain which started shortly before [place] moved back to the [place].  -has become more sedentary since moving back to take care of mother -patient states that she will start meeting with dietician to help with weight loss, starting mid June.  -She denies chest pain, chest pressure, or shortness of breath. she denies headaches or visual disturbances. She denies abdominal pain, nausea, vomiting, or changes in bowel or bladder habits.    Outpatient Encounter Medications as of 01/04/2023  Medication Sig   acetaminophen (TYLENOL) 500 MG tablet Take 1,000 mg by mouth every 6 (six) hours as needed for moderate pain or headache. 250-500mg    fluticasone (FLONASE) 50 MCG/ACT nasal spray Place 1 spray into both nostrils daily as needed for allergies or rhinitis.   MAGNESIUM PO Take 150 mg by mouth at bedtime as needed (sleep). Magnesium w/ B6 (5mg )   Multiple Vitamins-Minerals (MULTIVITAMIN WOMEN PO) Take 1 tablet by mouth daily. gummy   omeprazole (PRILOSEC) 40 MG capsule Take 1 capsule (40 mg total) by mouth daily.   Psyllium (METAMUCIL PO) Take 1 Scoop by mouth daily as needed (constipation).   telmisartan (MICARDIS) 80 MG tablet Take 1 tablet (80 mg total) by mouth daily.   Vitamin D, Ergocalciferol, (DRISDOL) 1.25 MG (50000 UNIT) CAPS capsule Take 1 capsule (50,000 Units total) by mouth every 7 (seven) days.   [DISCONTINUED] nebivolol (BYSTOLIC) 5 MG tablet Take 1 tablet (5 mg total) by mouth daily.   [DISCONTINUED] rosuvastatin (CRESTOR) 5 MG tablet Take 1 tablet (5 mg total) by mouth daily.   nebivolol (BYSTOLIC)  5 MG tablet Take 1 tablet (5 mg total) by mouth daily.   rosuvastatin (CRESTOR) 5 MG tablet Take 1 tablet (5 mg total) by mouth daily.   [DISCONTINUED] valsartan (DIOVAN) 160 MG tablet Take 1 tablet (160 mg total) by mouth daily.   No facility-administered encounter medications on file as of 01/04/2023.    Past Medical History:  Diagnosis Date   B12 deficiency    Depression    Fatty liver    High cholesterol    HTN (hypertension)    Infertility, female    PCOS (polycystic ovarian syndrome)    Prediabetes    Vitamin D deficiency     Past Surgical History:  Procedure Laterality Date   ANKLE RECONSTRUCTION Right 1995   age 35   DILATION AND EVACUATION  06/2011   GALLBLADDER SURGERY  2005   TONSILLECTOMY     age 18   WISDOM TOOTH EXTRACTION  1997   age 32    Family History  Problem Relation Age of Onset   Cancer Mother    Kidney disease Mother    Sleep apnea Mother    Obesity Mother    Obesity Father    Kidney disease Father    Stroke Father    Hyperlipidemia Father    Hypertension Father    Diabetes Father    Heart disease Father    Alcoholism Father     Social History   Socioeconomic History   Marital status: Married  Spouse name: Not on file   Number of children: Not on file   Years of education: Not on file   Highest education level: Not on file  Occupational History   Occupation: Stay at home  Tobacco Use   Smoking status: Never   Smokeless tobacco: Never  Substance and Sexual Activity   Alcohol use: Not on file   Drug use: Not on file   Sexual activity: Not on file  Other Topics Concern   Not on file  Social History Narrative   Not on file   Social Determinants of Health   Financial Resource Strain: Not on file  Food Insecurity: Not on file  Transportation Needs: Not on file  Physical Activity: Not on file  Stress: Not on file  Social Connections: Not on file  Intimate Partner Violence: Not on file    ROS See HPI     Objective     Today's Vitals   01/04/23 0811 01/04/23 0842  BP: (Abnormal) 145/85 122/82  Pulse: 82   SpO2: 97%   Weight: 288 lb (130.6 kg)   Height: 5\' 4"  (1.626 m)    Body mass index is 49.44 kg/m.   Physical Exam Vitals and nursing note reviewed.  Constitutional:      Appearance: Normal appearance. She is well-developed. She is obese.  HENT:     Head: Normocephalic and atraumatic.     Nose: Nose normal.     Mouth/Throat:     Mouth: Mucous membranes are moist.     Pharynx: Oropharynx is clear.  Eyes:     Extraocular Movements: Extraocular movements intact.     Conjunctiva/sclera: Conjunctivae normal.     Pupils: Pupils are equal, round, and reactive to light.  Neck:     Vascular: No carotid bruit.  Cardiovascular:     Rate and Rhythm: Normal rate and regular rhythm.     Pulses: Normal pulses.     Heart sounds: Normal heart sounds.  Pulmonary:     Effort: Pulmonary effort is normal.     Breath sounds: Normal breath sounds.  Abdominal:     Palpations: Abdomen is soft.  Musculoskeletal:        General: Normal range of motion.     Cervical back: Normal range of motion and neck supple.  Lymphadenopathy:     Cervical: No cervical adenopathy.  Skin:    General: Skin is warm and dry.     Capillary Refill: Capillary refill takes less than 2 seconds.  Neurological:     General: No focal deficit present.     Mental Status: She is alert and oriented to person, place, and time.  Psychiatric:        Mood and Affect: Mood normal.        Behavior: Behavior normal.        Thought Content: Thought content normal.        Judgment: Judgment normal.        Assessment & Plan:  Essential hypertension Assessment & Plan: Stable.  Continue current medication.   Orders: -     Telmisartan; Take 1 tablet (80 mg total) by mouth daily.  Dispense: 90 tablet; Refill: 1 -     Nebivolol HCl; Take 1 tablet (5 mg total) by mouth daily.  Dispense: 90 tablet; Refill: 1  Mixed  hyperlipidemia Assessment & Plan: For now, continue crestor as prescribed  -check fasting lipids prior to next visit and adjust dosing as indicated   Orders: -  Rosuvastatin Calcium; Take 1 tablet (5 mg total) by mouth daily.  Dispense: 90 tablet; Refill: 1  Gastroesophageal reflux disease without esophagitis Assessment & Plan: Intermittent.  -may take omeprazole as needed and as prescribed   Orders: -     Omeprazole; Take 1 capsule (40 mg total) by mouth daily.  Dispense: 90 capsule; Refill: 1  Encounter to establish care    Return in about 3 months (around 04/06/2023) for health maintenance exam, FBW a week prior to visit.   Carlean Jews, NP

## 2023-01-04 NOTE — Assessment & Plan Note (Signed)
For now, continue crestor as prescribed  -check fasting lipids prior to next visit and adjust dosing as indicated

## 2023-01-13 ENCOUNTER — Ambulatory Visit: Payer: Self-pay | Admitting: Nurse Practitioner

## 2023-03-30 ENCOUNTER — Other Ambulatory Visit: Payer: Commercial Managed Care - PPO

## 2023-04-06 ENCOUNTER — Encounter: Payer: Commercial Managed Care - PPO | Admitting: Family Medicine
